# Patient Record
Sex: Male | Born: 1955 | Race: White | Hispanic: No | Marital: Single | Smoking: Current every day smoker
Health system: Southern US, Community
[De-identification: ages and names within clinical notes are randomized; demographics above are authoritative.]

## PROBLEM LIST (undated history)

## (undated) DIAGNOSIS — J45909 Unspecified asthma, uncomplicated: Secondary | ICD-10-CM

## (undated) DIAGNOSIS — T783XXA Angioneurotic edema, initial encounter: Secondary | ICD-10-CM

## (undated) HISTORY — DX: Unspecified asthma, uncomplicated: J45.909

## (undated) HISTORY — DX: Angioneurotic edema, initial encounter: T78.3XXA

---

## 2002-05-25 ENCOUNTER — Encounter: Payer: Self-pay | Admitting: Family Medicine

## 2002-05-25 ENCOUNTER — Ambulatory Visit (HOSPITAL_COMMUNITY): Admission: RE | Admit: 2002-05-25 | Discharge: 2002-05-25 | Payer: Self-pay | Admitting: Family Medicine

## 2002-06-13 ENCOUNTER — Encounter: Admission: RE | Admit: 2002-06-13 | Discharge: 2002-06-13 | Payer: Self-pay | Admitting: Oncology

## 2002-06-13 ENCOUNTER — Encounter (HOSPITAL_COMMUNITY): Admission: RE | Admit: 2002-06-13 | Discharge: 2002-07-13 | Payer: Self-pay | Admitting: Oncology

## 2002-09-11 ENCOUNTER — Encounter: Admission: RE | Admit: 2002-09-11 | Discharge: 2002-09-11 | Payer: Self-pay | Admitting: Oncology

## 2002-09-11 ENCOUNTER — Encounter (HOSPITAL_COMMUNITY): Admission: RE | Admit: 2002-09-11 | Discharge: 2002-10-11 | Payer: Self-pay | Admitting: Oncology

## 2003-02-21 ENCOUNTER — Encounter: Admission: RE | Admit: 2003-02-21 | Discharge: 2003-02-21 | Payer: Self-pay | Admitting: Oncology

## 2003-09-21 ENCOUNTER — Ambulatory Visit (HOSPITAL_COMMUNITY): Admission: RE | Admit: 2003-09-21 | Discharge: 2003-09-21 | Payer: Self-pay | Admitting: General Surgery

## 2005-11-06 ENCOUNTER — Ambulatory Visit: Admission: RE | Admit: 2005-11-06 | Discharge: 2005-11-06 | Payer: Self-pay | Admitting: Orthopedic Surgery

## 2006-01-12 ENCOUNTER — Inpatient Hospital Stay (HOSPITAL_COMMUNITY): Admission: RE | Admit: 2006-01-12 | Discharge: 2006-01-15 | Payer: Self-pay | Admitting: Orthopedic Surgery

## 2006-07-30 IMAGING — CR DG CHEST 2V
2 series · 2 of 2 positions shown · non-contrast
Comparison: Report of 05/25/02 reviewed.  Images not available at the time of dictation.

CLINICAL DATA: Preadmission for O.R.  Cervical stenosis and shortness of breath.  
 CHEST ? 2 VIEW:

[view not recorded (1 of 2)]
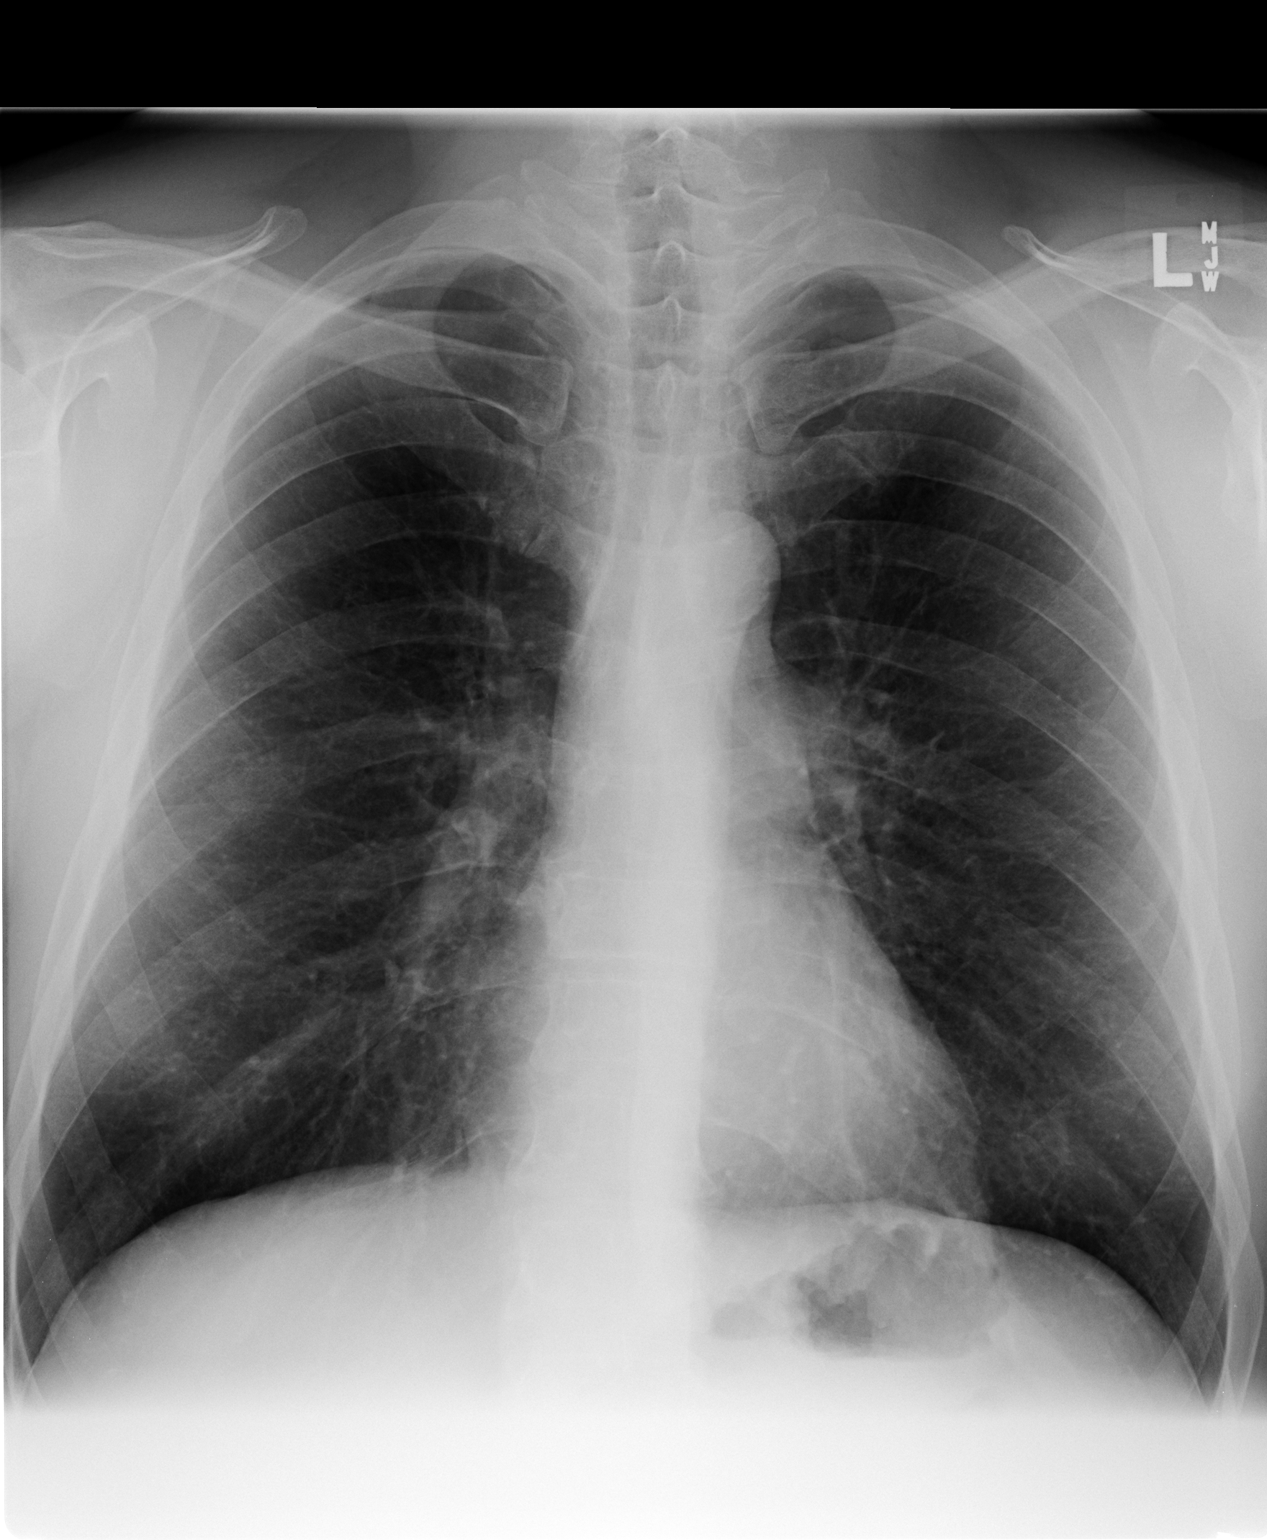

[view not recorded (2 of 2)]
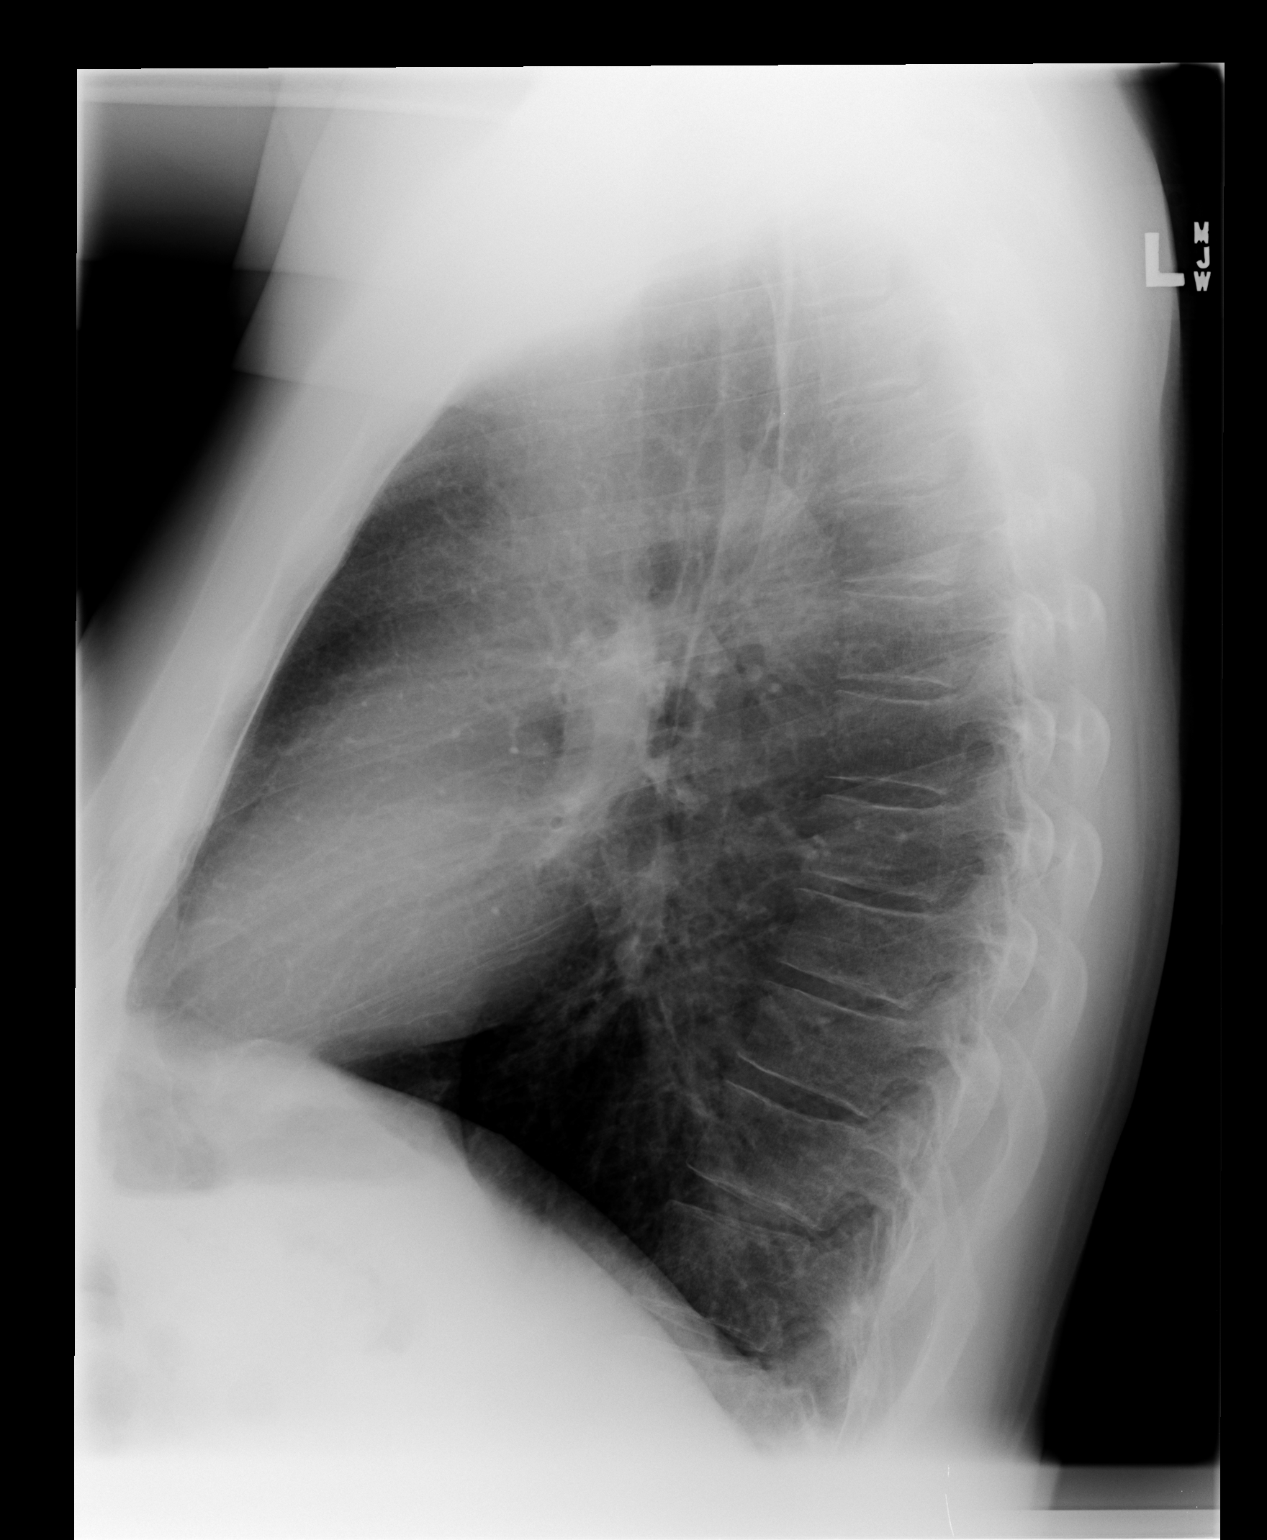

[2 of 2 positions shown; findings below may reference images not displayed]

FINDINGS: Trachea is midline.  Heart size is normal.  Lungs are clear.  No pleural fluid.  Biapical pleural thickening.
IMPRESSION: No acute findings.

## 2006-10-09 IMAGING — CR DG CERVICAL SPINE 2 OR 3 VIEWS
1 series · 1 of 1 positions shown · non-contrast
Comparison: none

CLINICAL DATA: Anterior cervical spine fusion.  
 CERVICAL SPINE - 2 VIEW:

[view not recorded]
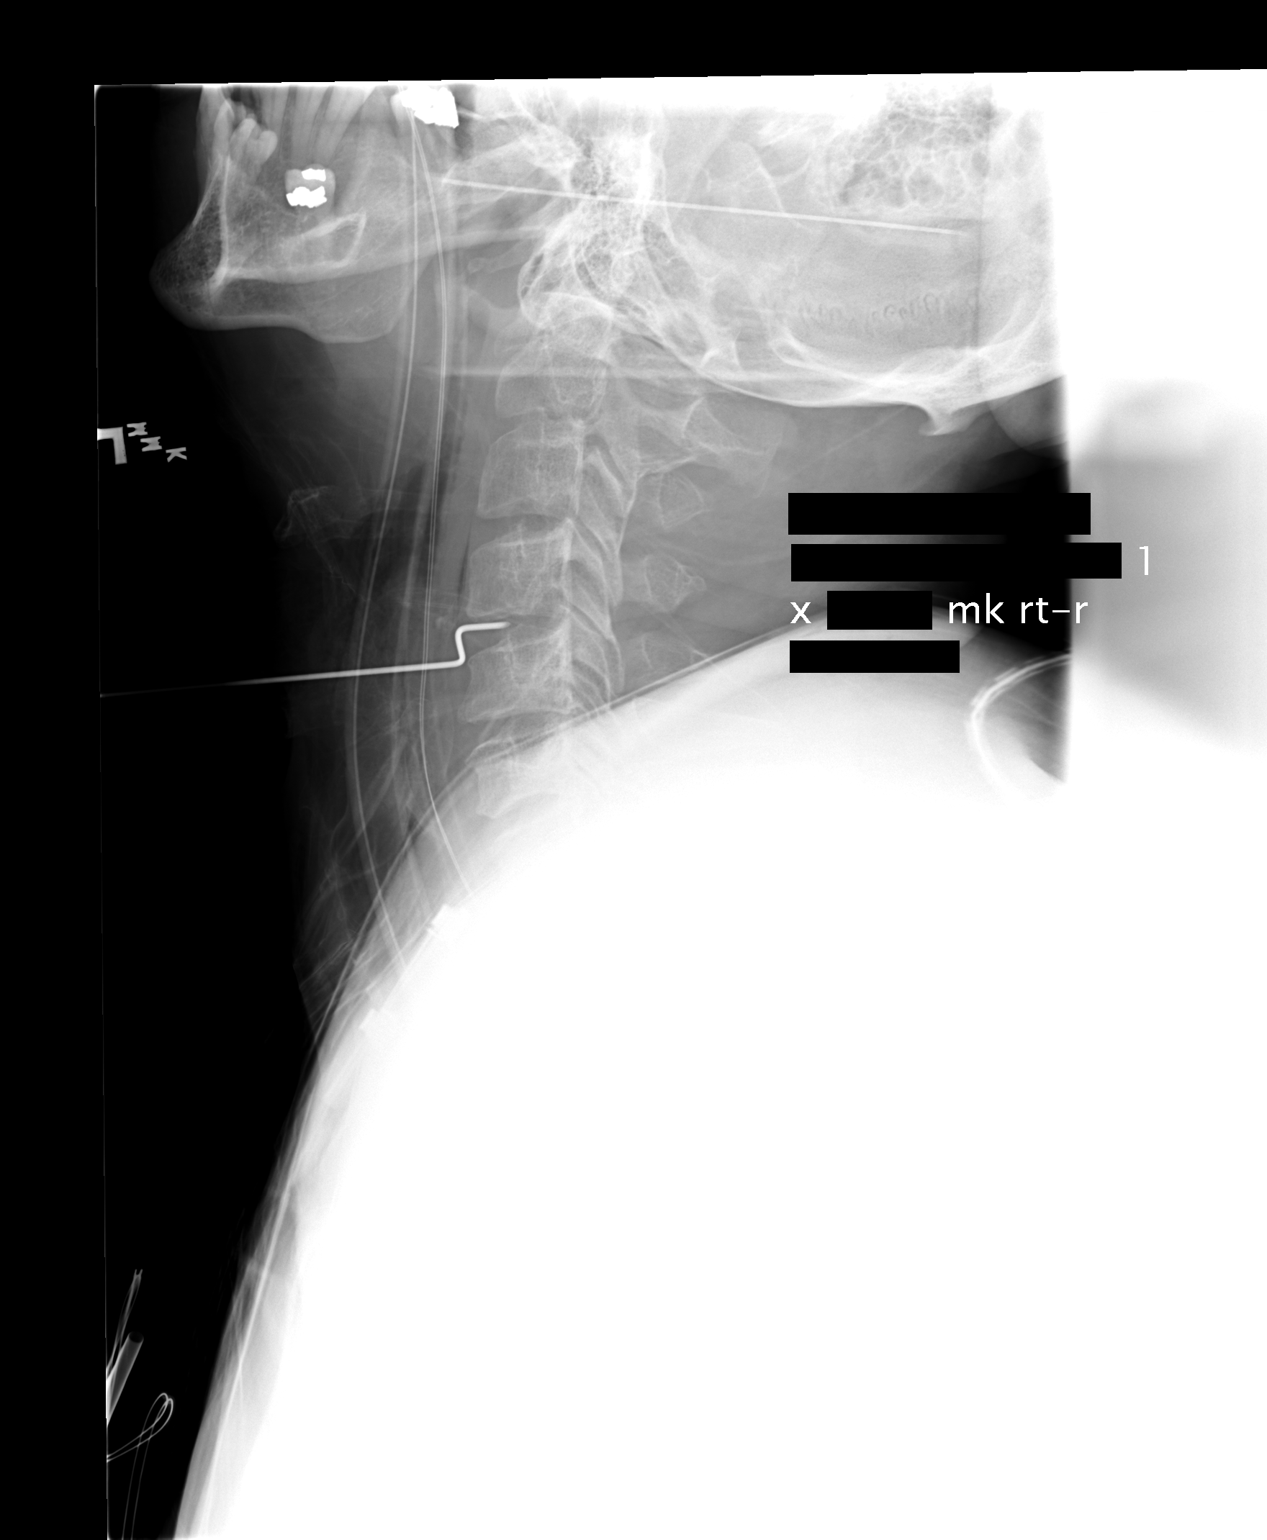

[1 of 1 positions shown; findings below may reference images not displayed]

FINDINGS: Two views of the cervical spine were obtained intraoperatively for localization.  The initial film shows a needle positioned anteriorly at the C4-5 level.  The second film shows anterior fusion has been performed at C5-6 and C6-7.  Interbody fusion plugs are in good position.
IMPRESSION: Anterior fusion of C5-6 and C6-7.

## 2006-10-11 IMAGING — CR DG CERVICAL SPINE 2 OR 3 VIEWS
2 series · 2 of 2 positions shown · non-contrast
Comparison: none

CLINICAL DATA: Cervical stenosis

Cervical spine 2 view:
Comparison to intraoperative films of 01/12/2006. Changes of anterior cervical
discectomy and fusion C5-C7. Hardware and graft markers appear intact and
appropriately positioned. Normal alignment. Mild prevertebral soft tissue
swelling at the level of the hardware.

[view not recorded (1 of 2)]
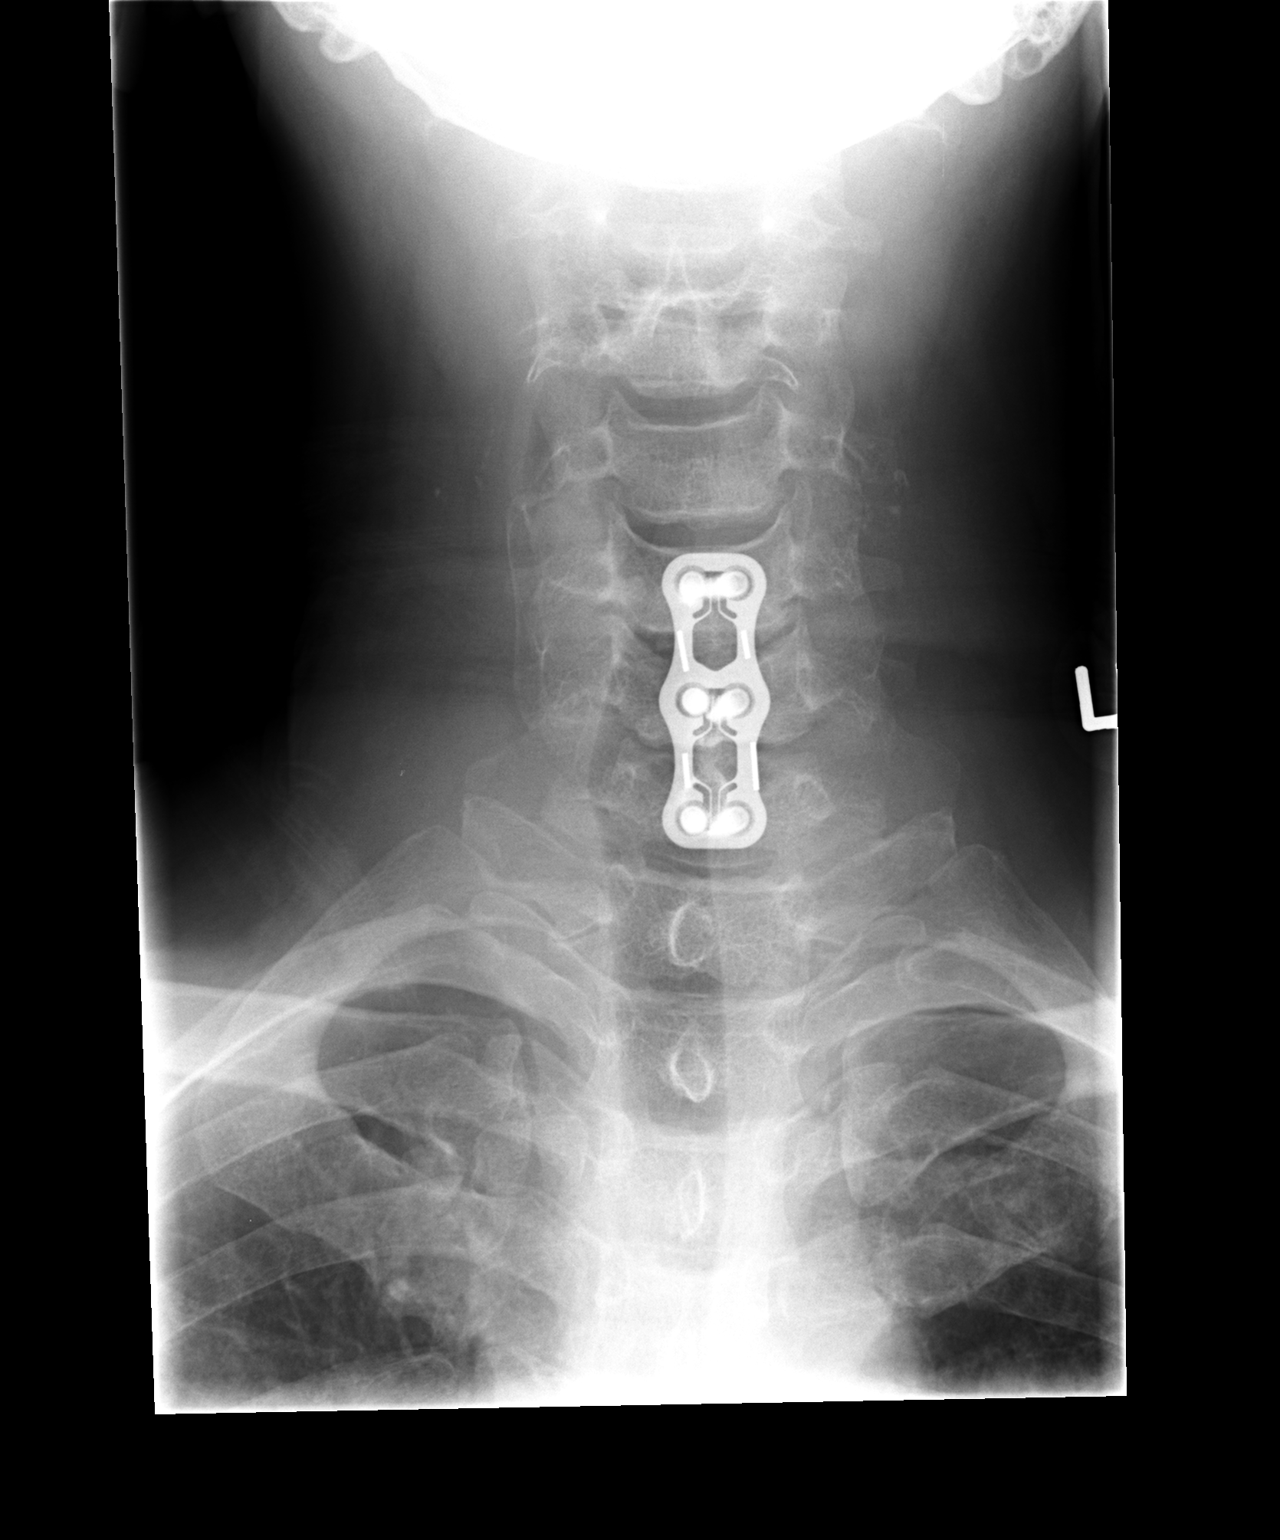

[view not recorded (2 of 2)]
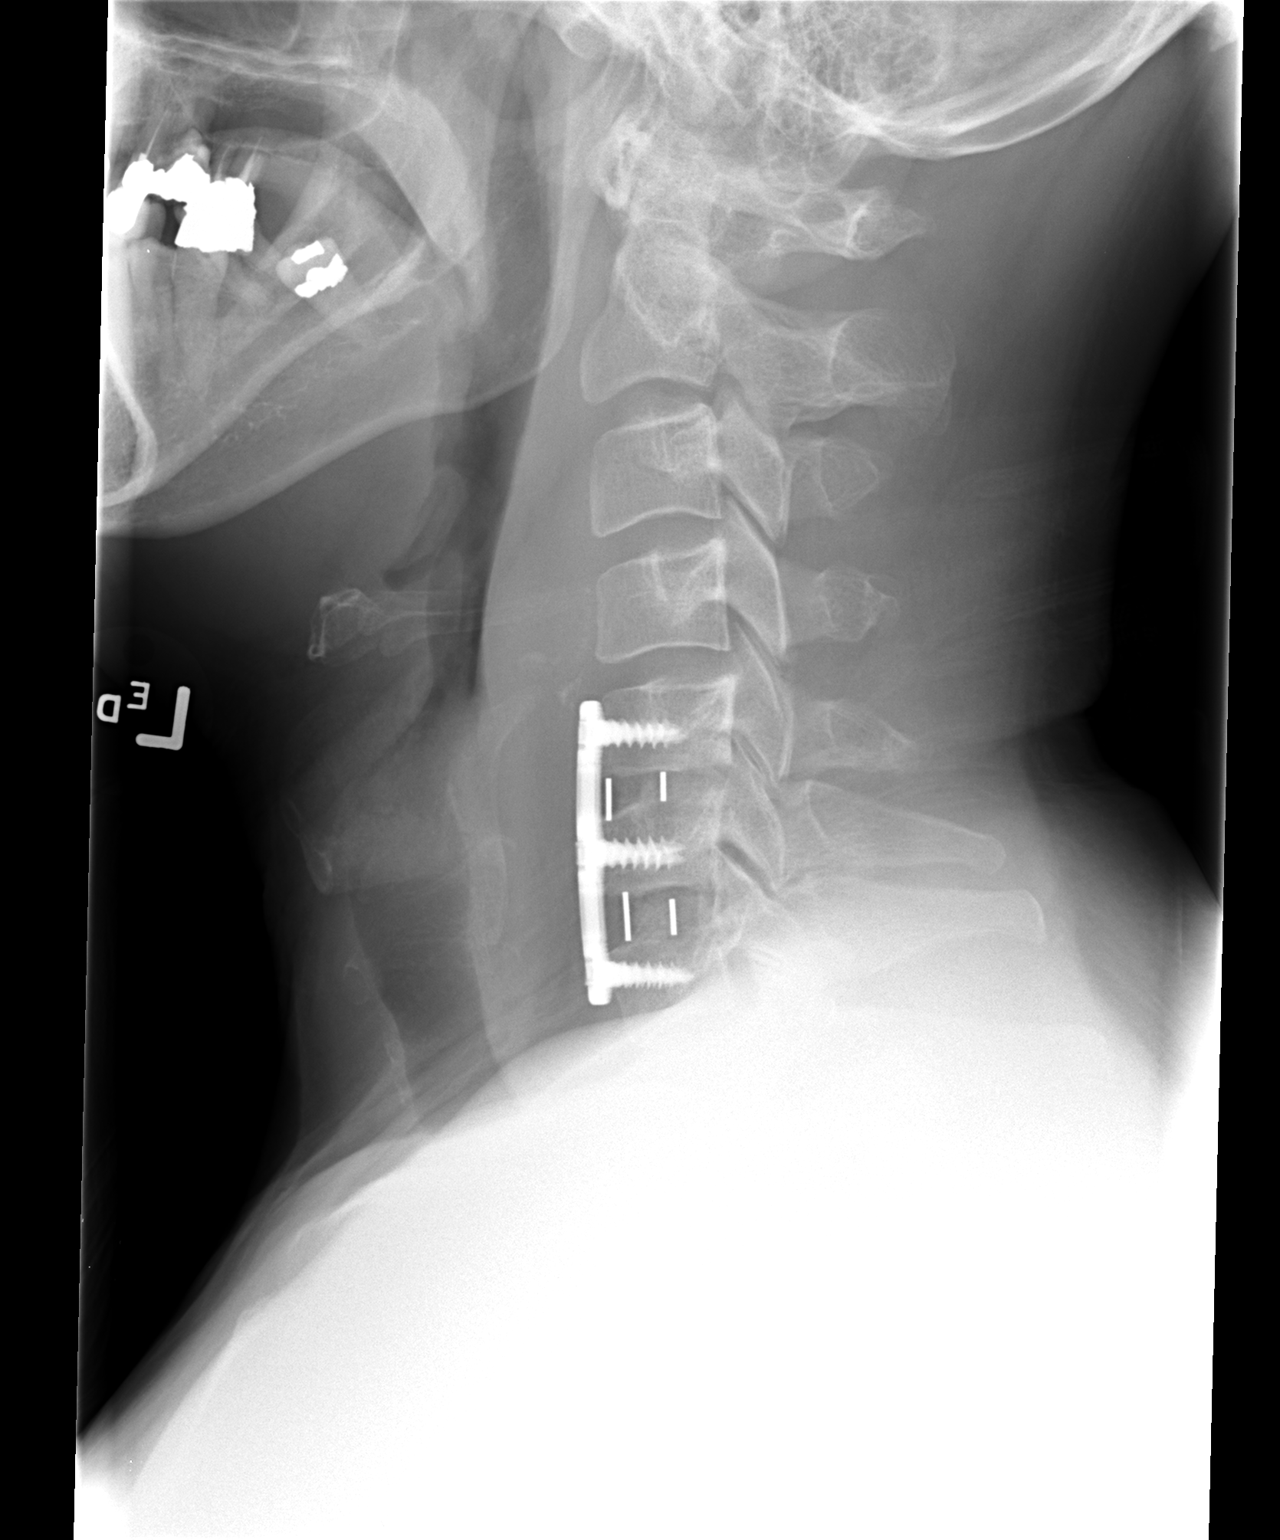

[2 of 2 positions shown; findings below may reference images not displayed]

IMPRESSION: 1. Anterior cervical discectomy and fusion C5-C7.

## 2016-04-20 HISTORY — PX: CARDIAC CATHETERIZATION: SHX172

## 2018-04-01 ENCOUNTER — Ambulatory Visit (INDEPENDENT_AMBULATORY_CARE_PROVIDER_SITE_OTHER): Payer: PRIVATE HEALTH INSURANCE | Admitting: Allergy & Immunology

## 2018-04-01 ENCOUNTER — Encounter: Payer: Self-pay | Admitting: Allergy & Immunology

## 2018-04-01 VITALS — BP 98/60 | HR 62 | Temp 97.8°F | Resp 18 | Ht 65.0 in | Wt 171.4 lb

## 2018-04-01 DIAGNOSIS — L508 Other urticaria: Secondary | ICD-10-CM

## 2018-04-01 DIAGNOSIS — J449 Chronic obstructive pulmonary disease, unspecified: Secondary | ICD-10-CM

## 2018-04-01 DIAGNOSIS — L299 Pruritus, unspecified: Secondary | ICD-10-CM | POA: Diagnosis not present

## 2018-04-01 MED ORDER — FAMOTIDINE 40 MG PO TABS: 40.0000 mg | ORAL_TABLET | Freq: Every day | ORAL | 5 refills | Status: DC

## 2018-04-01 MED ORDER — CETIRIZINE HCL 10 MG PO TABS: 10.0000 mg | ORAL_TABLET | Freq: Every day | ORAL | 5 refills | Status: DC

## 2018-04-01 MED ORDER — MONTELUKAST SODIUM 10 MG PO TABS: 10.0000 mg | ORAL_TABLET | Freq: Every day | ORAL | 5 refills | Status: DC

## 2018-04-01 MED ORDER — FEXOFENADINE HCL 180 MG PO TABS: 180.0000 mg | ORAL_TABLET | Freq: Every day | ORAL | 5 refills | Status: DC

## 2018-04-01 NOTE — Progress Notes (Signed)
NEW PATIENT  Date of Service/Encounter:  04/01/18  Referring provider: Grayland Jackichardson, Jennifer, PA-C   Assessment:   Itching  Chronic urticaria  COPD - managed by his PCP  Plan/Recommendations:   1. Itching with chronic urticaria - Your history does not have any "red flags" such as fevers, joint pains, or permanent skin changes that would be concerning for a more serious cause of hives.   - Add on Eucerin or Vanicream twice daily to keep your skin moist since moist skin does not itch as badly as dry skin.  - We will get some labs to see what might be triggering your symptoms, including an environmental allergy panel and alpha-gal panel to look for the red meat sensitivity.  - We will call you in 1-2 weeks to let you know the results of the testing.  - Chronic hives are often times a self limited process and will "burn themselves out" over 6-12 months, although this is not always the case.  - In the meantime, start suppressive dosing of antihistamines:   - Morning: Allegra (fexofenadine) 360mg  (two tablets)  - Evening: Zyrtec (cetirizine) 20mg  (two tablets) + Singulair (montelukast) 10mg   - If the above is not working, try adding: Pepcid (famotidine) 40mg  - You can change this dosing at home, decreasing the dose as needed or increasing the dosing as needed.  - If you are not tolerating the medications or are tired of taking them every day, we can start treatment with a monthly injectable medication called Xolair.   2. COPD - Lung testing looked like COPD and was not normal.  - Continue with all of your medications at this time. - We will not make changes to your breathing medications right now.   3. Return in about 4 weeks (around 04/29/2018).  Subjective:   Dennis Warren is a 62 y.o. male presenting today for evaluation of  Chief Complaint  Patient presents with  . Pruritis    bilateral lower extremities     Dennis FeilRobert M Warren has a history of the following: There  are no active problems to display for this patient.   History obtained from: chart review and patient, who is very verbose. He has to leave early because his mother needs a blood transfusion.   Dennis Feilobert M Warren was referred by Grayland Jackichardson, Jennifer, PA-C.     Dennis MaduroRobert is a 62 y.o. male presenting for an evaluation of itching. He reports that this has become very intense and he wants to "claw" them out. He likes to take very hot baths tho try to calm down the itching. He has tried a multitude of various medications without a problem. He has had two bursts of prednisone with improvement in his symptoms. He has been complaining of these symptoms for years and typically is occurs in the fall/winter. It always tends to be in the September to November time frame, but he has it now.   He tolerates all of the major food allergens without adverse event.  He does not notice any worsening of his symptoms in any particular environment, although again they seem to be concentrated between September and November. He has never had allergy testing to his knowledge. He does tell me that he had a biopsy of his leg done a "long time ago" and "nothing came of it".  He does have COPD, but does not remember which medication he is on for this.  This is all managed by his primary care provider.  Otherwise, there is  no history of other atopic diseases, including food allergies, drug allergies, stinging insect allergies or eczema. There is no significant infectious history. Vaccinations are up to date.    Past Medical History: There are no active problems to display for this patient.   Medication List:  Allergies as of 04/01/2018      Reactions   Amoxicillin    Dizziness   Pneumovax [pneumococcal Polysaccharide Vaccine] Anaphylaxis      Medication List       Accurate as of April 01, 2018  3:43 PM. Always use your most recent med list.        cetirizine 10 MG tablet Commonly known as:  ZYRTEC Take 1 tablet  (10 mg total) by mouth daily.   famotidine 40 MG tablet Commonly known as:  PEPCID Take 1 tablet (40 mg total) by mouth daily.   fexofenadine 180 MG tablet Commonly known as:  ALLEGRA Take 1 tablet (180 mg total) by mouth daily.   montelukast 10 MG tablet Commonly known as:  SINGULAIR Take 1 tablet (10 mg total) by mouth at bedtime.       Birth History: non-contributory  Developmental History: non-contributory.   Past Surgical History: Past Surgical History:  Procedure Laterality Date  . CARDIAC CATHETERIZATION N/A 2018     Family History: Family History  Problem Relation Age of Onset  . Allergic rhinitis Brother   . Celiac disease Brother      Social History: Dennis Warren lives at home with his family.  They live in a house that was built in 1948. He has hardwood in the main living areas and linoleum in the bedrooms. He has kerosene and wood for heating and central cooling. There are no dust mite coverings on the bedding. There is tobacco exposure (one pack per day). 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     Review of Systems: a 14-point review of systems is pertinent for what is mentioned in HPI.  Otherwise, all other systems were negative. Constitutional: negative other than that listed in the HPI Eyes: negative other than that listed in the HPI Ears, nose, mouth, throat, and face: negative other than that listed in the HPI Respiratory: negative other than that listed in the HPI Cardiovascular: negative other than that listed in the HPI Gastrointestinal: negative other than that listed in the HPI Genitourinary: negative other than that listed in the HPI Integument: negative other than that listed in the HPI Hematologic: negative other than that listed in the HPI Musculoskeletal: negative other than that listed in the HPI Neurological: negative other than that listed in the HPI Allergy/Immunologic: negative other than that listed in the  HPI    Objective:   Blood pressure 98/60, pulse 62, temperature 97.8 F (36.6 C), temperature source Oral, resp. rate 18, height 5\' 5"  (1.651 m), weight 171 lb 6.4 oz (77.7 kg), SpO2 94 %. Body mass index is 28.52 kg/m.   Physical Exam:  General: Alert, interactive, in no acute distress. Talkative.  Eyes: No conjunctival injection bilaterally, no discharge on the right, no discharge on the left and no Horner-Trantas dots present. PERRL bilaterally. EOMI without pain. No photophobia.  Ears: Right TM pearly gray with normal light reflex, Left TM pearly gray with normal light reflex, Right TM intact without perforation and Left TM intact without perforation.  Nose/Throat: External nose within normal limits and septum midline. Turbinates edematous with clear discharge. Posterior oropharynx erythematous without cobblestoning in the posterior oropharynx.  Tonsils 2+ without exudates.  Tongue without thrush. Neck: Supple without thyromegaly. Trachea midline. Adenopathy: no enlarged lymph nodes appreciated in the anterior cervical, occipital, axillary, epitrochlear, inguinal, or popliteal regions. Lungs: Clear to auscultation without wheezing, rhonchi or rales. No increased work of breathing. CV: Normal S1/S2. No murmurs. Capillary refill <2 seconds.  Abdomen: Nondistended, nontender. No guarding or rebound tenderness. Bowel sounds present in all fields and hypoactive  Skin: Very dry skin over the entire body with excorations present. Extremities:  No clubbing, cyanosis or edema. Neuro:   Grossly intact. No focal deficits appreciated. Responsive to questions.  Diagnostic studies: none        Malachi Bonds, MD [facility]

## 2018-04-01 NOTE — Patient Instructions (Addendum)
1. Itching with chronic urticaria - Your history does not have any "red flags" such as fevers, joint pains, or permanent skin changes that would be concerning for a more serious cause of hives.   - Add on Eucerin or Vanicream twice daily to keep your skin moist since moist skin does not itch as badly as dry skin.  - We will get some labs to see what might be triggering your symptoms, including an environmental allergy panel and alpha-gal panel to look for the red meat sensitivity.  - We will call you in 1-2 weeks to let you know the results of the testing.  - Chronic hives are often times a self limited process and will "burn themselves out" over 6-12 months, although this is not always the case.  - In the meantime, start suppressive dosing of antihistamines:   - Morning: Allegra (fexofenadine) 360mg  (two tablets)  - Evening: Zyrtec (cetirizine) 20mg  (two tablets) + Singulair (montelukast) 10mg   - If the above is not working, try adding: Pepcid (famotidine) 40mg  - You can change this dosing at home, decreasing the dose as needed or increasing the dosing as needed.  - If you are not tolerating the medications or are tired of taking them every day, we can start treatment with a monthly injectable medication called Xolair.   2. COPD - Lung testing looked like COPD and was not normal.  - Continue with all of your medications at this time. - We will not make changes to your breathing medications right now.   3. Return in about 4 weeks (around 04/29/2018).   Please inform us of any Emergency Department visits, hospitalizations, or changes in symptoms. Call us before going to the ED for breathing or allergy symptoms since we might be able to fit you in for a sick visit. Feel free to contact us anytime with any questions, problems, or concerns.  It was a pleasure to meet you today!  Websites that have reliable patient information: 1. American Academy of Asthma, Allergy, and Immunology:  www.aaaai.org 2. Food Allergy Research and Education (FARE): foodallergy.org 3. Mothers of Asthmatics: http://www.asthmacommunitynetwork.org 4. American College of Allergy, Asthma, and Immunology: MissingWeapons.cawww.acaai.org   Make sure you are registered to vote! If you have moved or changed any of your contact information, you will need to get this updated before voting!

## 2018-04-06 LAB — IGE+ALLERGENS ZONE 2(30)

## 2018-04-06 LAB — ALLERGEN PROFILE, MOLD
Aureobasidi Pullulans IgE: <0.1 kU/L
Candida Albicans IgE: <0.1 kU/L
M009-IgE Fusarium proliferatum: <0.1 kU/L
M014-IgE Epicoccum purpur: <0.1 kU/L
Phoma Betae IgE: <0.1 kU/L
Setomelanomma Rostrat: <0.1 kU/L

## 2018-04-06 LAB — ALPHA-GAL PANEL
Alpha Gal IgE*: <0.1 kU/L (ref <0.10)
Beef (Bos spp) IgE: <0.1 kU/L (ref <0.35)
Class Interpretation: 0
Class Interpretation: 0
Class Interpretation: 0
Lamb/Mutton (Ovis spp) IgE: <0.1 kU/L (ref <0.35)
Pork (Sus spp) IgE: <0.1 kU/L (ref <0.35)

## 2018-04-06 LAB — TRYPTASE: Tryptase: 9.8 ug/L (ref 2.2–13.2)

## 2018-04-12 NOTE — Progress Notes (Signed)
Space has been blocked

## 2018-04-29 ENCOUNTER — Ambulatory Visit: Payer: PRIVATE HEALTH INSURANCE | Admitting: Allergy & Immunology

## 2018-05-11 ENCOUNTER — Encounter: Payer: Self-pay | Admitting: Allergy & Immunology

## 2018-05-11 ENCOUNTER — Ambulatory Visit (INDEPENDENT_AMBULATORY_CARE_PROVIDER_SITE_OTHER): Payer: PRIVATE HEALTH INSURANCE | Admitting: Allergy & Immunology

## 2018-05-11 VITALS — BP 136/94 | HR 55 | Temp 98.3°F | Resp 18 | Ht 66.97 in | Wt 172.4 lb

## 2018-05-11 DIAGNOSIS — L508 Other urticaria: Secondary | ICD-10-CM

## 2018-05-11 DIAGNOSIS — L299 Pruritus, unspecified: Secondary | ICD-10-CM

## 2018-05-11 MED ORDER — MONTELUKAST SODIUM 10 MG PO TABS: 10.0000 mg | ORAL_TABLET | Freq: Every day | ORAL | 5 refills | Status: AC

## 2018-05-11 MED ORDER — CETIRIZINE HCL 10 MG PO TABS: 10.0000 mg | ORAL_TABLET | Freq: Every day | ORAL | 5 refills | Status: AC

## 2018-05-11 MED ORDER — FEXOFENADINE HCL 180 MG PO TABS: 180.0000 mg | ORAL_TABLET | Freq: Every day | ORAL | 5 refills | Status: AC

## 2018-05-11 MED ORDER — FAMOTIDINE 40 MG PO TABS: 40.0000 mg | ORAL_TABLET | Freq: Every day | ORAL | 5 refills | Status: AC
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
## 2018-05-11 NOTE — Patient Instructions (Addendum)
1. Itching with chronic urticaria - We will send your medications into [facility] today to see if they can fill it. - Continue with moisturizing with Eucerin or Vanicream twice daily to keep your skin moist since moist skin does not itch as badly as dry skin.  - We will do environmental testing at the next visit (stop your cetirizine three days before that visit).  - Chronic hives are often times a self limited process and will "burn themselves out" over 6-12 months, although this is not always the case.  - In the meantime, start suppressive dosing of antihistamines:   - Morning: Zyrtec (cetirizine) 10-20mg  (two tablets)  - Evening: Zyrtec (cetirizine) 10-20mg  (two tablets) + Singulair (montelukast) 10mg   - If the above is not working, try adding: Pepcid (famotidine) 40mg  - You can change this dosing at home, decreasing the dose as needed or increasing the dosing as needed.  - If you are not tolerating the medications or are tired of taking them every day, we can start treatment with a monthly injectable medication called Xolair.    2. Leg coldness - Talk to your PCP about this. - You might need ultrasounds of your legs.   3. Return in about 4 weeks (around 06/08/2018) for SKIN TESTING.   Please inform us of any Emergency Department visits, hospitalizations, or changes in symptoms. Call us before going to the ED for breathing or allergy symptoms since we might be able to fit you in for a sick visit. Feel free to contact us anytime with any questions, problems, or concerns.  It was a pleasure to see you again today!  Websites that have reliable patient information: 1. American Academy of Asthma, Allergy, and Immunology: www.aaaai.org 2. Food Allergy Research and Education (FARE): foodallergy.org 3. Mothers of Asthmatics: http://www.asthmacommunitynetwork.org 4. American College of Allergy, Asthma, and Immunology: MissingWeapons.ca   Make sure you are registered to vote! If you have moved or  changed any of your contact information, you will need to get this updated before voting!

## 2018-06-08 ENCOUNTER — Ambulatory Visit: Payer: PRIVATE HEALTH INSURANCE | Admitting: Allergy & Immunology
